# Patient Record
Sex: Female | Born: 1990 | Race: White | Hispanic: No | Marital: Single | Smoking: Never smoker
Health system: Southern US, Community
[De-identification: ages and names within clinical notes are randomized; demographics above are authoritative.]

---
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
## 2011-11-26 IMAGING — CR DG KNEE COMPLETE 4+V*R*
1 series · 4 of 4 positions shown · non-contrast
Comparison: none

REASON FOR EXAM: fell, c/o pain
COMMENTS:   LMP: One week ago

[Series 1: view not recorded · 0.17mm/px · 4 of 4 slices shown]
[im 1/4]
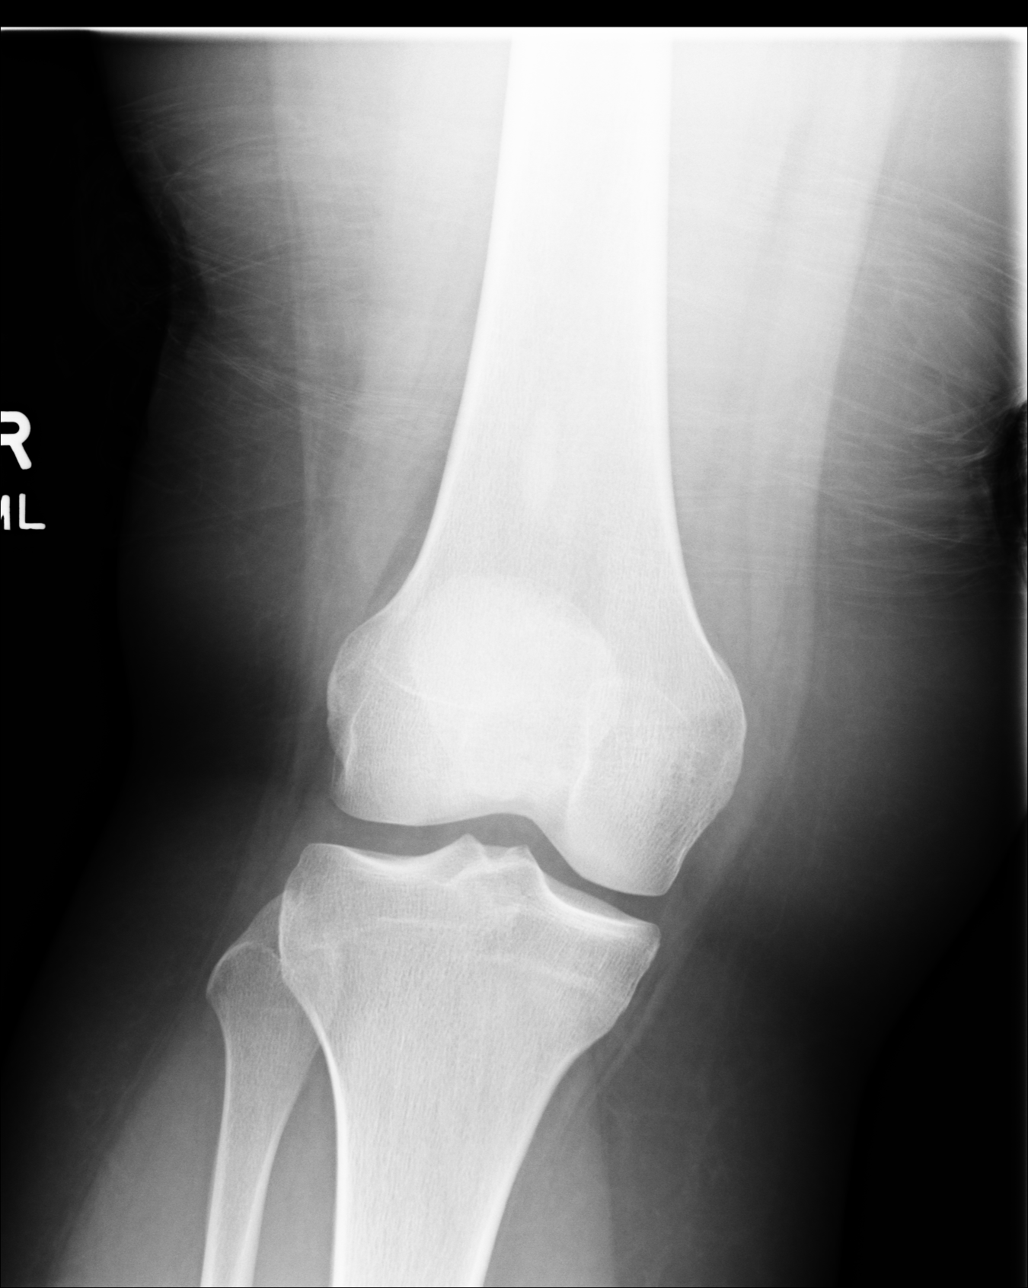
[im 2/4]
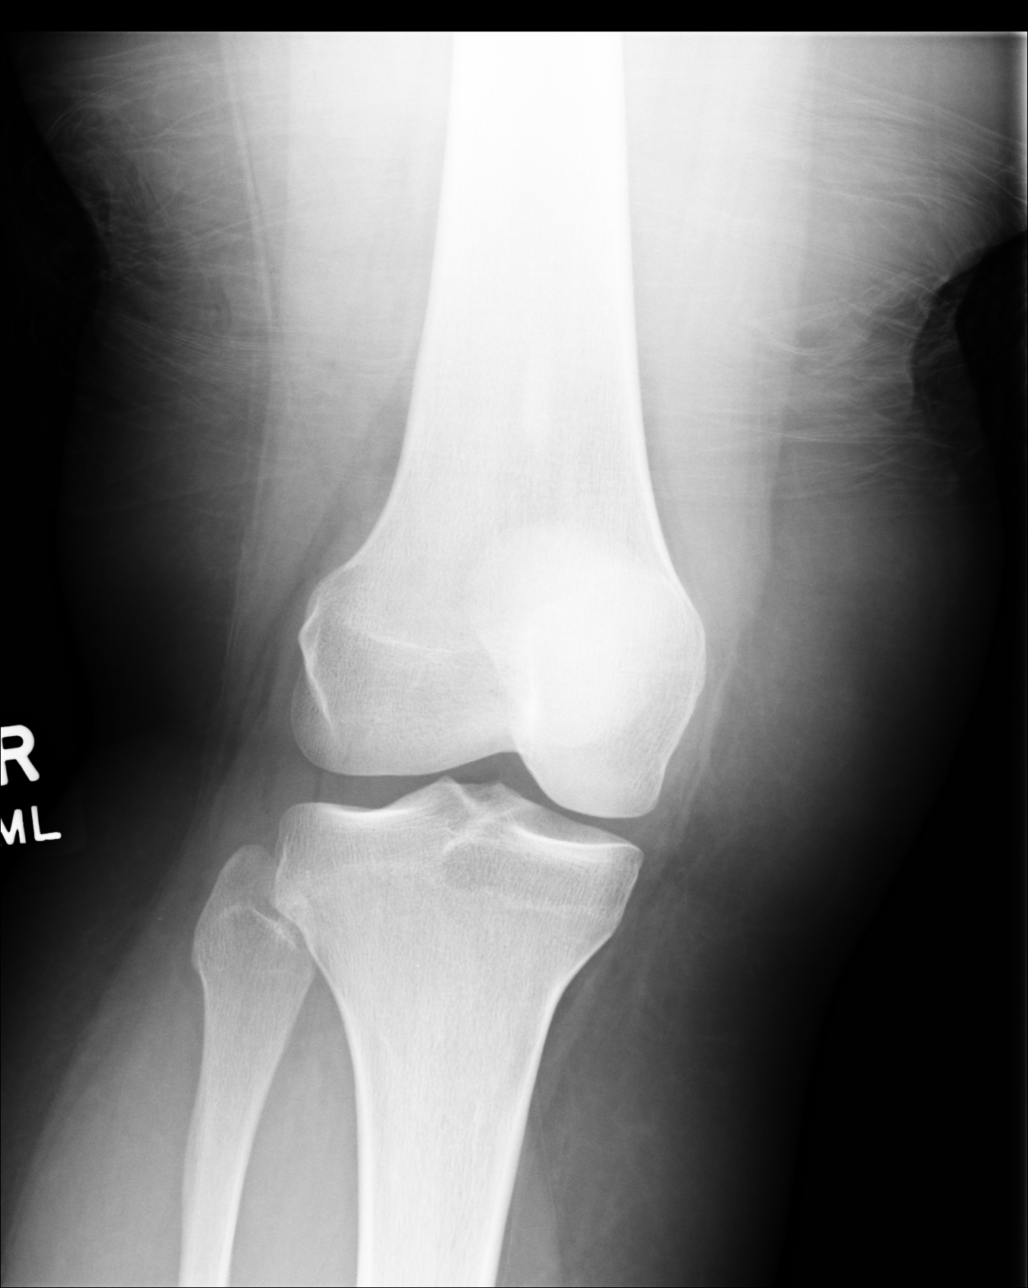
[im 3/4]
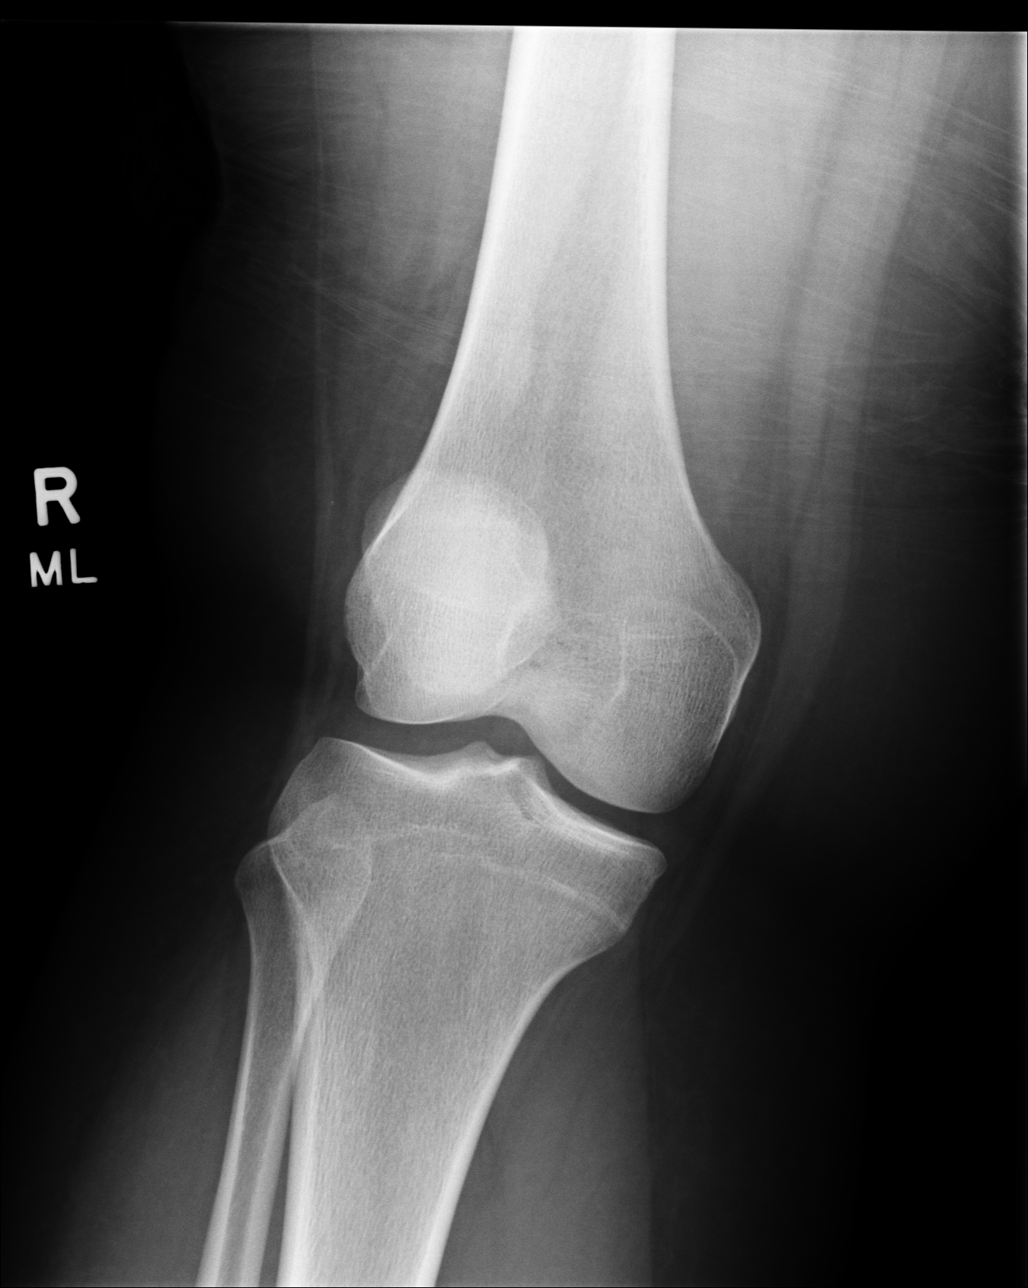
[im 4/4]
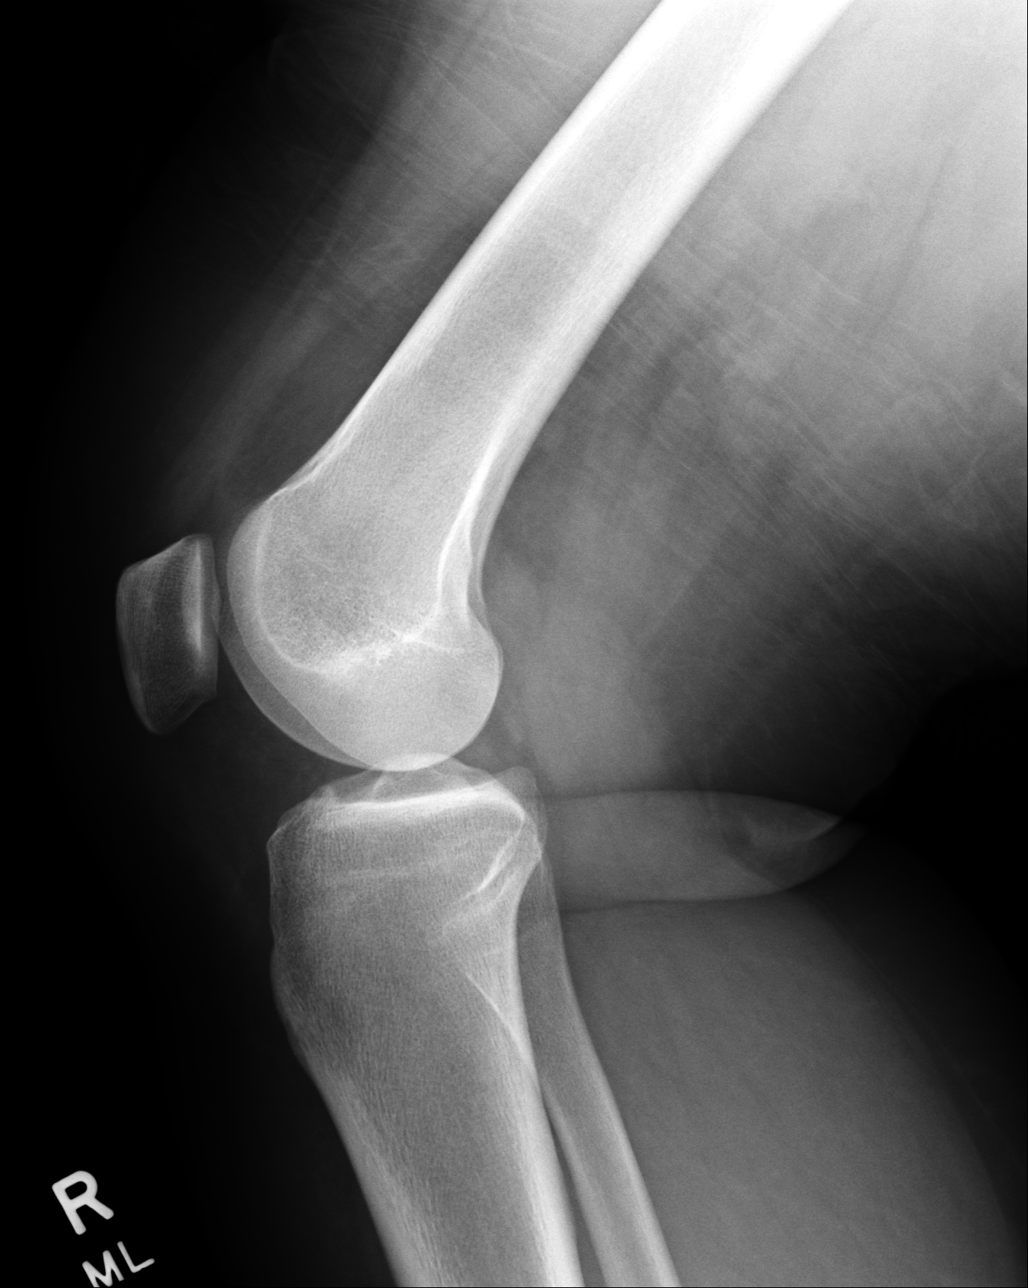

[4 of 4 positions shown; findings below may reference images not displayed]

PROCEDURE:     MDR - MDR KNEE RT COMPLETE W/OBLIQUES  - October 23, 2009  [DATE]

RESULT:     There does not appear to be evidence of fracture, dislocation,
or malalignment.  No evidence of a prepatellar effusion is identified. If
there is persistent clinical concern or persistent complaints of pain,
repeat evaluation in 7-10 days is recommended if clinically warranted.
IMPRESSION: 1. Unremarkable right knee.

## 2021-12-05 ENCOUNTER — Encounter: Payer: Self-pay | Admitting: Emergency Medicine

## 2021-12-05 ENCOUNTER — Ambulatory Visit (INDEPENDENT_AMBULATORY_CARE_PROVIDER_SITE_OTHER): Payer: BC Managed Care – PPO

## 2021-12-05 ENCOUNTER — Ambulatory Visit
Admission: EM | Admit: 2021-12-05 | Discharge: 2021-12-05 | Disposition: A | Discharge status: Home / Self Care | Payer: BC Managed Care – PPO | Attending: Physician Assistant | Admitting: Physician Assistant

## 2021-12-05 DIAGNOSIS — R062 Wheezing: Secondary | ICD-10-CM | POA: Diagnosis not present

## 2021-12-05 DIAGNOSIS — Z7951 Long term (current) use of inhaled steroids: Secondary | ICD-10-CM | POA: Diagnosis not present

## 2021-12-05 DIAGNOSIS — Z1152 Encounter for screening for COVID-19: Secondary | ICD-10-CM | POA: Insufficient documentation

## 2021-12-05 DIAGNOSIS — R0602 Shortness of breath: Secondary | ICD-10-CM | POA: Diagnosis not present

## 2021-12-05 DIAGNOSIS — J180 Bronchopneumonia, unspecified organism: Secondary | ICD-10-CM | POA: Diagnosis present

## 2021-12-05 DIAGNOSIS — Z79899 Other long term (current) drug therapy: Secondary | ICD-10-CM | POA: Insufficient documentation

## 2021-12-05 DIAGNOSIS — R058 Other specified cough: Secondary | ICD-10-CM | POA: Diagnosis not present

## 2021-12-05 LAB — RESP PANEL BY RT-PCR (FLU A&B, COVID) ARPGX2
Influenza A by PCR: NEGATIVE
Influenza B by PCR: NEGATIVE
SARS Coronavirus 2 by RT PCR: NEGATIVE

## 2021-12-05 MED ORDER — ALBUTEROL SULFATE HFA 108 (90 BASE) MCG/ACT IN AERS: 2.0000 | INHALATION_SPRAY | RESPIRATORY_TRACT | 0 refills | Status: AC | PRN

## 2021-12-05 MED ORDER — ALBUTEROL SULFATE (2.5 MG/3ML) 0.083% IN NEBU
2.5000 mg | INHALATION_SOLUTION | Freq: Once | RESPIRATORY_TRACT | Status: AC
  Administered 2021-12-05: 2.5 mg via RESPIRATORY_TRACT

## 2021-12-05 MED ORDER — AZITHROMYCIN 500 MG PO TABS: 500.0000 mg | ORAL_TABLET | Freq: Every day | ORAL | 0 refills | Status: AC

## 2021-12-05 MED ORDER — PREDNISONE 20 MG PO TABS: 20.0000 mg | ORAL_TABLET | Freq: Every day | ORAL | 0 refills | Status: AC

## 2021-12-05 NOTE — ED Provider Notes (Signed)
[facility]    CSN: [id number] Arrival date & time: 12/05/21  1139      History   Chief Complaint Chief Complaint  Patient presents with   Cough    HPI Cathy Phillips is a 31 y.o. female who presents with onset of nose congestion, R ear pain, ST, fatigue, sweats x 5 days. She has not checked her temp. She started with ST and mild rhinitis and cough, and on the second day had HA, chills, sweats and body aches which lasted for 3 days. Has been wheezing since yesterday and having cough attacks. Has been around kids with colds last week,but they were gone when she developed her symptoms. ST is still present and hurts to swallow.     History reviewed. No pertinent past medical history.  There are no problems to display for this patient.   History reviewed. No pertinent surgical history.  OB History   No obstetric history on file.      Home Medications    Prior to Admission medications   Medication Sig Start Date End Date Taking? Authorizing Provider  albuterol (VENTOLIN HFA) 108 (90 Base) MCG/ACT inhaler Inhale 2 puffs into the lungs every 4 (four) hours as needed for wheezing or shortness of breath. For 5 days q 4h, then prn cough and wheezing 12/05/21  Yes Rodriguez-Southworth, Nettie Elm, PA-C  azithromycin (ZITHROMAX) 500 MG tablet Take 1 tablet (500 mg total) by mouth daily. 12/05/21  Yes Rodriguez-Southworth, Nettie Elm, PA-C  predniSONE (DELTASONE) 20 MG tablet Take 1 tablet (20 mg total) by mouth daily with breakfast. 12/05/21  Yes Rodriguez-Southworth, Nettie Elm, PA-C    Family History No family history on file.  Social History Social History   Tobacco Use   Smoking status: Never   Smokeless tobacco: Never  Vaping Use   Vaping Use: Never used  Substance Use Topics   Alcohol use: Never   Drug use: Never     Allergies   Patient has no known allergies.   Review of Systems Review of Systems  Constitutional:  Positive for appetite change, chills,  diaphoresis and fatigue.  HENT:  Positive for congestion, ear pain, postnasal drip, rhinorrhea and sore throat. Negative for ear discharge and trouble swallowing.   Eyes:  Negative for discharge.  Respiratory:  Positive for cough and wheezing.   Cardiovascular:  Negative for chest pain.  Musculoskeletal:  Positive for myalgias.  Neurological:  Positive for headaches.     Physical Exam Triage Vital Signs ED Triage Vitals  Enc Vitals Group     BP 12/05/21 1343 118/86     Pulse Rate 12/05/21 1343 88     Resp 12/05/21 1343 18     Temp 12/05/21 1343 98.4 F (36.9 C)     Temp Source 12/05/21 1343 Oral     SpO2 12/05/21 1343 95 %     Weight 12/05/21 1342 300 lb (136.1 kg)     Height 12/05/21 1342 5\' 6"  (1.676 m)     Head Circumference --      Peak Flow --      Pain Score 12/05/21 1341 4     Pain Loc --      Pain Edu? --      Excl. in GC? --    No data found.  Updated Vital Signs BP 118/86 (BP Location: Right Arm)   Pulse 88   Temp 98.4 F (36.9 C) (Oral)   Resp 18   Ht 5\' 6"  (1.676 m)  Wt 300 lb (136.1 kg)   LMP 11/14/2021 (Approximate)   SpO2 95%   BMI 48.42 kg/m   Visual Acuity Right Eye Distance:   Left Eye Distance:   Bilateral Distance:    Right Eye Near:   Left Eye Near:    Bilateral Near:      Physical Exam Constitutional:      General: He is not in acute distress.    Appearance: He is not toxic-appearing.  HENT:     Head: Normocephalic.     Right Ear: Tympanic membrane, ear canal and external ear normal.     Left Ear: Ear canal and external ear normal.     Nose: Nose normal.     Mouth/Throat:     Mouth: Mucous membranes are moist.     Pharynx: Oropharynx is clear.  Eyes:     General: No scleral icterus.    Conjunctiva/sclera: Conjunctivae normal.  Cardiovascular:     Rate and Rhythm: Normal rate and regular rhythm.     Heart sounds: No murmur heard.   Pulmonary:     Effort: Pulmonary effort is normal. No respiratory distress.     Breath  sounds: Wheezing present.     Comments: Has auditory wheezing Musculoskeletal:        General: Normal range of motion.     Cervical back: Neck supple.  Lymphadenopathy:     Cervical: No cervical adenopathy.  Skin:    General: Skin is warm and dry.     Findings: No rash.  Neurological:     Mental Status: He is alert and oriented to person, place, and time.     Gait: Gait normal.  Psychiatric:        Mood and Affect: Mood normal.        Behavior: Behavior normal.        Thought Content: Thought content normal.        Judgment: Judgment normal.    UC Treatments / Results  Labs (all labs ordered are listed, but only abnormal results are displayed) Labs Reviewed  RESP PANEL BY RT-PCR (FLU A&B, COVID) [id number]  GROUP A STREP BY PCR  Flu A&B and covid test are negative PCR strep is pending EKG   Radiology DG Chest 2 View  Result Date: 12/05/2021 CLINICAL DATA:  Wheezing.  Shortness of breath. EXAM: CHEST - 2 VIEW COMPARISON:  None Available. FINDINGS: Heart and mediastinal shadows are normal. There is patchy infiltrate in the lingula consistent with bronchopneumonia. There is central bronchial thickening. No effusion. No abnormal bone finding. IMPRESSION: Lingular bronchopneumonia. Central bronchial thickening. Electronically Signed   By: Paulina Fusi M.D.   On: 12/05/2021 14:49    Procedures Procedures (including critical care time)  Medications Ordered in UC Medications  albuterol (PROVENTIL) (2.5 MG/3ML) 0.083% nebulizer solution 2.5 mg (2.5 mg Nebulization Given 12/05/21 1431)    Initial Impression / Assessment and Plan / UC Course  I have reviewed the triage vital signs and the nursing notes.  Pertinent labs & imaging results that were available during my care of the patient were reviewed by me and considered in my medical decision making (see chart for details).  She was given albuterol neb and this helped the wheezing resolve.   Has lingula bronchopneumonia  I placed  her on Azithromycin, Prednisone and Albuterol inhaler as noted. See instructions.   Final Clinical Impressions(s) / UC Diagnoses   Final diagnoses:  Bronchopneumonia     Discharge Instructions  Make sure to follow up in 6-8 weeks to have a chest xray repeated with your primary care provider to make sure the pneumonia has resolved.      ED Prescriptions     Medication Sig Dispense Auth. Provider   azithromycin (ZITHROMAX) 500 MG tablet Take 1 tablet (500 mg total) by mouth daily. 5 tablet Rodriguez-Southworth, Nettie Elm, PA-C   predniSONE (DELTASONE) 20 MG tablet Take 1 tablet (20 mg total) by mouth daily with breakfast. 5 tablet Rodriguez-Southworth, Nettie Elm, PA-C   albuterol (VENTOLIN HFA) 108 (90 Base) MCG/ACT inhaler Inhale 2 puffs into the lungs every 4 (four) hours as needed for wheezing or shortness of breath. For 5 days q 4h, then prn cough and wheezing 18 g Rodriguez-Southworth, Nettie Elm, PA-C      PDMP not reviewed this encounter.   Garey Ham, PA-C 12/05/21 1526

## 2021-12-05 NOTE — Discharge Instructions (Signed)
Make sure to follow up in 6-8 weeks to have a chest xray repeated with your primary care provider to make sure the pneumonia has resolved.

## 2021-12-05 NOTE — ED Triage Notes (Signed)
Pt c/o nasal congestion, right ear pain, sweats, sore throat, fatigue, cough. Started about 5 days ago. She is unsure if she has had a fever.
